# Patient Record
Sex: Female | Born: 2000 | Hispanic: Yes | Smoking: Never smoker
Health system: Southern US, Community
[De-identification: ages and names within clinical notes are randomized; demographics above are authoritative.]

## PROBLEM LIST (undated history)

## (undated) DIAGNOSIS — O210 Mild hyperemesis gravidarum: Secondary | ICD-10-CM

## (undated) DIAGNOSIS — J45909 Unspecified asthma, uncomplicated: Secondary | ICD-10-CM

## (undated) DIAGNOSIS — K219 Gastro-esophageal reflux disease without esophagitis: Secondary | ICD-10-CM

## (undated) HISTORY — PX: NO PAST SURGERIES: SHX2092

---
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
## 2022-01-05 ENCOUNTER — Other Ambulatory Visit: Payer: Self-pay | Admitting: *Deleted

## 2022-01-05 MED ORDER — ONDANSETRON 4 MG PO TBDP: 4.0000 mg | ORAL_TABLET | Freq: Three times a day (TID) | ORAL | 1 refills | Status: DC | PRN

## 2022-01-05 NOTE — Progress Notes (Signed)
Zofran refilled as previously ordered per Dr Donavan Foil approval. ?

## 2022-01-19 ENCOUNTER — Inpatient Hospital Stay (HOSPITAL_COMMUNITY)
Admission: AD | Admit: 2022-01-19 | Discharge: 2022-01-19 | Disposition: A | Discharge status: Home / Self Care | Payer: Medicaid Other | Attending: Obstetrics and Gynecology | Admitting: Obstetrics and Gynecology

## 2022-01-19 ENCOUNTER — Other Ambulatory Visit: Payer: Self-pay

## 2022-01-19 ENCOUNTER — Encounter (HOSPITAL_COMMUNITY): Payer: Self-pay | Admitting: Obstetrics and Gynecology

## 2022-01-19 DIAGNOSIS — O210 Mild hyperemesis gravidarum: Secondary | ICD-10-CM | POA: Diagnosis not present

## 2022-01-19 DIAGNOSIS — O99612 Diseases of the digestive system complicating pregnancy, second trimester: Secondary | ICD-10-CM | POA: Insufficient documentation

## 2022-01-19 DIAGNOSIS — Z79899 Other long term (current) drug therapy: Secondary | ICD-10-CM | POA: Insufficient documentation

## 2022-01-19 DIAGNOSIS — K920 Hematemesis: Secondary | ICD-10-CM

## 2022-01-19 DIAGNOSIS — R12 Heartburn: Secondary | ICD-10-CM | POA: Diagnosis not present

## 2022-01-19 DIAGNOSIS — Z3A22 22 weeks gestation of pregnancy: Secondary | ICD-10-CM | POA: Diagnosis not present

## 2022-01-19 DIAGNOSIS — R109 Unspecified abdominal pain: Secondary | ICD-10-CM | POA: Insufficient documentation

## 2022-01-19 DIAGNOSIS — O26892 Other specified pregnancy related conditions, second trimester: Secondary | ICD-10-CM | POA: Insufficient documentation

## 2022-01-19 LAB — CBC WITH DIFFERENTIAL/PLATELET
Abs Immature Granulocytes: 0.13 10*3/uL — ABNORMAL HIGH (ref 0.00–0.07)
Basophils Absolute: 0 10*3/uL (ref 0.0–0.1)
Basophils Relative: 0 %
Eosinophils Absolute: 0 10*3/uL (ref 0.0–0.5)
Eosinophils Relative: 0 %
HCT: 31.2 % — ABNORMAL LOW (ref 36.0–46.0)
Hemoglobin: 10.3 g/dL — ABNORMAL LOW (ref 12.0–15.0)
Immature Granulocytes: 1 %
Lymphocytes Relative: 4 %
Lymphs Abs: 0.7 10*3/uL (ref 0.7–4.0)
MCH: 30.5 pg (ref 26.0–34.0)
MCHC: 33 g/dL (ref 30.0–36.0)
MCV: 92.3 fL (ref 80.0–100.0)
Monocytes Absolute: 0.3 10*3/uL (ref 0.1–1.0)
Monocytes Relative: 2 %
Neutro Abs: 14.8 10*3/uL — ABNORMAL HIGH (ref 1.7–7.7)
Neutrophils Relative %: 93 %
Platelets: 307 10*3/uL (ref 150–400)
RBC: 3.38 MIL/uL — ABNORMAL LOW (ref 3.87–5.11)
RDW: 13.5 % (ref 11.5–15.5)
WBC: 16 10*3/uL — ABNORMAL HIGH (ref 4.0–10.5)
nRBC: 0 % (ref 0.0–0.2)

## 2022-01-19 LAB — BASIC METABOLIC PANEL
Anion gap: 7 (ref 5–15)
BUN: <5 mg/dL — ABNORMAL LOW (ref 6–20)
CO2: 23 mmol/L (ref 22–32)
Calcium: 9 mg/dL (ref 8.9–10.3)
Chloride: 110 mmol/L (ref 98–111)
Creatinine, Ser: 0.59 mg/dL (ref 0.44–1.00)
GFR, Estimated: >60 mL/min (ref >60)
Glucose, Bld: 88 mg/dL (ref 70–99)
Potassium: 3.8 mmol/L (ref 3.5–5.1)
Sodium: 140 mmol/L (ref 135–145)

## 2022-01-19 MED ORDER — PANTOPRAZOLE SODIUM 40 MG IV SOLR
40.0000 mg | Freq: Once | INTRAVENOUS | Status: DC
  Filled 2022-01-19: qty 10

## 2022-01-19 MED ORDER — CALCIUM CARBONATE ANTACID 500 MG PO CHEW
400.0000 mg | CHEWABLE_TABLET | Freq: Three times a day (TID) | ORAL | Status: DC | PRN
  Administered 2022-01-19: 400 mg via ORAL
  Filled 2022-01-19: qty 2

## 2022-01-19 MED ORDER — PROMETHAZINE HCL 25 MG PO TABS: 25.0000 mg | ORAL_TABLET | Freq: Four times a day (QID) | ORAL | 2 refills | Status: DC | PRN

## 2022-01-19 MED ORDER — LACTATED RINGERS IV BOLUS
1000.0000 mL | Freq: Once | INTRAVENOUS | Status: AC
  Administered 2022-01-19: 1000 mL via INTRAVENOUS

## 2022-01-19 MED ORDER — ONDANSETRON 4 MG PO TBDP
4.0000 mg | ORAL_TABLET | Freq: Three times a day (TID) | ORAL | 2 refills | Status: DC | PRN
Start: 2022-01-19 — End: 2022-05-17

## 2022-01-19 MED ORDER — PROCHLORPERAZINE EDISYLATE 10 MG/2ML IJ SOLN
10.0000 mg | Freq: Once | INTRAMUSCULAR | Status: AC
  Administered 2022-01-19: 10 mg via INTRAVENOUS
  Filled 2022-01-19: qty 2

## 2022-01-19 MED ORDER — PANTOPRAZOLE SODIUM 40 MG PO TBEC: 40.0000 mg | DELAYED_RELEASE_TABLET | Freq: Two times a day (BID) | ORAL | 5 refills | Status: DC

## 2022-01-19 MED ORDER — PANTOPRAZOLE 80MG IVPB - SIMPLE MED
80.0000 mg | Freq: Once | INTRAVENOUS | Status: AC
  Administered 2022-01-19: 80 mg via INTRAVENOUS
  Filled 2022-01-19: qty 80

## 2022-01-19 MED ORDER — PROCHLORPERAZINE MALEATE 10 MG PO TABS: 10.0000 mg | ORAL_TABLET | Freq: Two times a day (BID) | ORAL | 2 refills | Status: DC | PRN

## 2022-01-19 MED ORDER — SODIUM CHLORIDE 0.9 % IV SOLN
8.0000 mg | Freq: Once | INTRAVENOUS | Status: AC
  Administered 2022-01-19: 8 mg via INTRAVENOUS
  Filled 2022-01-19: qty 4

## 2022-01-19 MED ORDER — LACTATED RINGERS IV SOLN: INTRAVENOUS | Status: DC

## 2022-01-19 NOTE — MAU Provider Note (Signed)
?History  ?  ? ?CSN: [id number] ? ?Arrival date and time: 01/19/22 1448 ? ? Event Date/Time  ? First Provider Initiated Contact with Patient 01/19/22 1528   ?  ? ?Chief Complaint  ?Patient presents with  ? Emesis  ? Nausea  ? Abdominal Pain  ? ?Gina Meyer is a 21 y.o. G1P0 at 73w6dwho receives care at [facility].  She presents today for Emesis, Nausea, and Abdominal Pain.  She states she started having vomiting around 0700 that has been ongoing.  She reports about 20 incidents of vomiting with onset of bloody vomit around 0800.  She states she attempted to take her medications, but threw them up and did not have any refills. Patient states she is regularly taking promethazine, protonix, and zofran.  She states she is taking some other medications too, but can not recall the name.  Patient is actively vomiting while speaking with provider.  ? ? ?OB History   ? ? Gravida  ?1  ? Para  ?   ? Term  ?   ? Preterm  ?   ? AB  ?   ? Living  ?   ?  ? ? SAB  ?   ? IAB  ?   ? Ectopic  ?   ? Multiple  ?   ? Live Births  ?   ?   ?  ?  ? ? ?Past Medical History:  ?Diagnosis Date  ? Asthma   ? ? ?Past Surgical History:  ?Procedure Laterality Date  ? NO PAST SURGERIES    ? ? ?History reviewed. No pertinent family history. ? ?Social History  ? ?Tobacco Use  ? Smoking status: Never  ? Smokeless tobacco: Never  ?Vaping Use  ? Vaping Use: Never used  ?Substance Use Topics  ? Alcohol use: Never  ? Drug use: Not Currently  ?  Types: Marijuana  ?  Comment: last used September 2022  ? ? ?Allergies: No Known Allergies ? ?Medications Prior to Admission  ?Medication Sig Dispense Refill Last Dose  ? aspirin EC 81 MG tablet Take 1 tablet (81 mg total) by mouth daily. Swallow whole. 30 tablet 11 01/18/2022  ? docusate sodium (COLACE) 100 MG capsule Take 1 capsule (100 mg total) by mouth 2 (two) times daily as needed. 30 capsule 2 01/18/2022  ? glycopyrrolate (ROBINUL) 2 MG tablet Take 1 tablet (2 mg total) by mouth 3 (three) times daily as  needed. 30 tablet 3 01/18/2022  ? ondansetron (ZOFRAN-ODT) 4 MG disintegrating tablet Take 1 tablet (4 mg total) by mouth every 8 (eight) hours as needed for nausea or vomiting. 30 tablet 1 01/18/2022  ? pantoprazole (PROTONIX) 40 MG tablet Take 1 tablet (40 mg total) by mouth daily. 30 tablet 5 01/18/2022  ? Prenatal Vit-Fe Phos-FA-Omega (VITAFOL GUMMIES) 3.33-0.333-34.8 MG CHEW Chew 3 tablets by mouth daily. 90 tablet 11 01/18/2022  ? prochlorperazine (COMPAZINE) 10 MG tablet Take 1 tablet (10 mg total) by mouth 2 (two) times daily as needed for nausea or vomiting. 30 tablet 2 01/18/2022  ? promethazine (PHENERGAN) 25 MG tablet Take 1 tablet (25 mg total) by mouth every 6 (six) hours as needed for nausea or vomiting. 30 tablet 2 01/18/2022  ? scopolamine (TRANSDERM-SCOP) 1 MG/3DAYS Place 1 patch (1.5 mg total) onto the skin every 3 (three) days. 10 patch 12 01/19/2022  ? acetaminophen (TYLENOL) 500 MG tablet Take 1,000 mg by mouth every 6 (six) hours as needed for mild pain or headache. (  Patient not taking: Reported on 12/26/2021)     ? Blood Pressure Monitoring (BLOOD PRESSURE KIT) DEVI 1 Device by Does not apply route as needed. (Patient not taking: Reported on 12/26/2021) 1 each 0   ? metoCLOPramide (REGLAN) 10 MG tablet Take 1 tablet (10 mg total) by mouth every 6 (six) hours. (Patient not taking: Reported on 12/26/2021) 30 tablet 2   ? Misc. Devices (GOJJI WEIGHT SCALE) MISC 1 Units by Does not apply route as directed. (Patient not taking: Reported on 12/26/2021) 1 each 0   ? promethazine (PHENERGAN) 25 MG suppository Place 1 suppository (25 mg total) rectally every 6 (six) hours as needed for nausea or vomiting. (Patient not taking: Reported on 12/26/2021) 12 each 2   ? ? ?Review of Systems  ?Gastrointestinal:  Positive for nausea and vomiting.  ?Physical Exam  ? ?Temperature 98.9 ?F (37.2 ?C), temperature source Oral, last menstrual period 08/12/2021. ? ?Physical Exam ?Vitals reviewed.  ?Constitutional:   ?   Appearance:  She is well-developed.  ?HENT:  ?   Head: Normocephalic and atraumatic.  ?Eyes:  ?   Conjunctiva/sclera: Conjunctivae normal.  ?Cardiovascular:  ?   Rate and Rhythm: Normal rate and regular rhythm.  ?Pulmonary:  ?   Effort: Pulmonary effort is normal. No respiratory distress.  ?Skin: ?   General: Skin is warm and dry.  ?Neurological:  ?   Mental Status: She is alert and oriented to person, place, and time.  ?Psychiatric:     ?   Mood and Affect: Mood normal.     ?   Behavior: Behavior normal.  ? ? ?MAU Course  ?Procedures ?Results for orders placed or performed during the hospital encounter of 01/19/22 (from the past 24 hour(s))  ?CBC with Differential/Platelet     Status: Abnormal  ? Collection Time: 01/19/22  4:10 PM  ?Result Value Ref Range  ? WBC 16.0 (H) 4.0 - 10.5 K/uL  ? RBC 3.38 (L) 3.87 - 5.11 MIL/uL  ? Hemoglobin 10.3 (L) 12.0 - 15.0 g/dL  ? HCT 31.2 (L) 36.0 - 46.0 %  ? MCV 92.3 80.0 - 100.0 fL  ? MCH 30.5 26.0 - 34.0 pg  ? MCHC 33.0 30.0 - 36.0 g/dL  ? RDW 13.5 11.5 - 15.5 %  ? Platelets 307 150 - 400 K/uL  ? nRBC 0.0 0.0 - 0.2 %  ? Neutrophils Relative % 93 %  ? Neutro Abs 14.8 (H) 1.7 - 7.7 K/uL  ? Lymphocytes Relative 4 %  ? Lymphs Abs 0.7 0.7 - 4.0 K/uL  ? Monocytes Relative 2 %  ? Monocytes Absolute 0.3 0.1 - 1.0 K/uL  ? Eosinophils Relative 0 %  ? Eosinophils Absolute 0.0 0.0 - 0.5 K/uL  ? Basophils Relative 0 %  ? Basophils Absolute 0.0 0.0 - 0.1 K/uL  ? Immature Granulocytes 1 %  ? Abs Immature Granulocytes 0.13 (H) 0.00 - 0.07 K/uL  ? ? ?MDM ?Start IV ?LR Bolus  ?Antiemetic ?PPI ?Labs: CBC/D, BMP ?Assessment and Plan  ?21 year old, G1P0  ?SIUP at 22.6 weeks ?HyperEmesis ?Hematemesis  ? ?-Reviewed POC with patient. ?-Exam performed.  ?-Start IV,LR Bolus ?-Labs ordered ?-Discussed usage of Protonix, compazine, and Zofran for management of symptoms ?-Patient agreeable.  ?-In consideration of hematemesis will give 28m of Protonix ?-Compazine 167mIV ?-Will follow with Zofran.  ?-Will monitor and  reassess ? ?JeMaryann Conners3/30/2023, 3:28 PM  ? ?Reassessment (4:42 PM) ?-Nurse reports patient continues to have vomiting, but now  consistency is coffee ground. ?-GI paged and Dr. Tonia Brooms returns call for consult. Informed of patient status, evaluation, interventions, and results. Advised: ?*Agrees with infusion of Protonix in setting of hematemesis. ?*Reviewed potential differentials including esophagitis or Mallory-Weiss Tear. ?*Recommends observation with GI consult. States will place patient on list for inpatient consult/evaluation. ?-Informed that provider will discuss observation recommendation with OB attending. ?-Patient updated on consult and recommendation.  ?-Reports vomiting has improved with compazine, however reports some heartburn and requesting tums. Ordered ?-Zofran ordered and infusing. ? ?Reassessment (5:38 PM) ?-Patient states she does not want to stay overnight d/t social issues. ?-Dr. Kathlen Mody consulted and informed of patient status, evaluation, interventions, and results. Advised: ?*Okay with discharge if patient desires.  ?*Call GI and consult regarding appropriate discharge medications. ?-Dr. Mamie Nick. Mann-Hung consulted and advised continue Protonix and okay to increase to BID. No need for outpatient appt unless symptoms return. ?**We appreciate Dr. Adriana Mccallum consultation and collaboration of care with this patient. ?-Patient informed of plan and medication list modified to reflect new changes. ?-Precautions reviewed. ?-Encouraged to call primary office or return to MAU if symptoms worsen or with the onset of new symptoms. ?-Discharged to home in improved condition. ? ?Maryann Conners MSN, CNM ?Energy manager, Center for [employer] ? ? ? ? ?

## 2022-01-19 NOTE — MAU Note (Signed)
.  Gina Meyer is a 21 y.o. at 108w6d here in MAU reporting: pt reports she has been vomiting with a lot of blood since 8 am, pt reports she has vomited at least 20 times and her lower stomach hurts every time she vomits now. Feels lightheaded and dizzy every time she stands up ?Onset of complaint: 8am ?Pain score: 9/10 ?  ?FHT:138 ?Lab orders placed from triage:urine ?   ? ?

## 2022-02-02 ENCOUNTER — Ambulatory Visit: Payer: Medicaid Other | Admitting: *Deleted

## 2022-02-02 ENCOUNTER — Ambulatory Visit: Payer: Medicaid Other | Attending: Maternal & Fetal Medicine

## 2022-02-02 VITALS — BP 106/73 | HR 91

## 2022-02-02 DIAGNOSIS — F1291 Cannabis use, unspecified, in remission: Secondary | ICD-10-CM

## 2022-02-02 DIAGNOSIS — Z3401 Encounter for supervision of normal first pregnancy, first trimester: Secondary | ICD-10-CM

## 2022-02-02 DIAGNOSIS — J45909 Unspecified asthma, uncomplicated: Secondary | ICD-10-CM | POA: Diagnosis not present

## 2022-02-02 DIAGNOSIS — O210 Mild hyperemesis gravidarum: Secondary | ICD-10-CM | POA: Diagnosis not present

## 2022-02-02 DIAGNOSIS — Z3A24 24 weeks gestation of pregnancy: Secondary | ICD-10-CM | POA: Insufficient documentation

## 2022-02-02 DIAGNOSIS — O99512 Diseases of the respiratory system complicating pregnancy, second trimester: Secondary | ICD-10-CM | POA: Insufficient documentation

## 2022-02-02 DIAGNOSIS — Z362 Encounter for other antenatal screening follow-up: Secondary | ICD-10-CM

## 2022-02-02 DIAGNOSIS — O99322 Drug use complicating pregnancy, second trimester: Secondary | ICD-10-CM | POA: Diagnosis not present

## 2022-02-20 ENCOUNTER — Encounter: Payer: Medicaid Other | Admitting: Advanced Practice Midwife

## 2022-02-20 ENCOUNTER — Other Ambulatory Visit: Payer: Medicaid Other

## 2022-02-20 NOTE — Progress Notes (Deleted)
   PRENATAL VISIT NOTE  Subjective:  Gina Meyer is a 21 y.o. G1P0 at [redacted]w[redacted]d being seen today for ongoing prenatal care.  She is currently monitored for the following issues for this {Blank single:19197::"high-risk","low-risk"} pregnancy and has Encounter for supervision of normal first pregnancy in first trimester and Hyperemesis on their problem list.  Patient reports {sx:14538}.   .  .   . Denies leaking of fluid.   The following portions of the patient's history were reviewed and updated as appropriate: allergies, current medications, past family history, past medical history, past social history, past surgical history and problem list.   Objective:  There were no vitals filed for this visit.  Fetal Status:           General:  Alert, oriented and cooperative. Patient is in no acute distress.  Skin: Skin is warm and dry. No rash noted.   Cardiovascular: Normal heart rate noted  Respiratory: Normal respiratory effort, no problems with respiration noted  Abdomen: Soft, gravid, appropriate for gestational age.        Pelvic: {Blank single:19197::"Cervical exam performed in the presence of a chaperone","Cervical exam deferred"}        Extremities: Normal range of motion.     Mental Status: Normal mood and affect. Normal behavior. Normal judgment and thought content.   Assessment and Plan:  Pregnancy: G1P0 at [redacted]w[redacted]d 1. Encounter for supervision of normal first pregnancy in first trimester ***  2. Hyperemesis gravidarum ***  3. [redacted] weeks gestation of pregnancy ***  {Blank single:19197::"Term","Preterm"} labor symptoms and general obstetric precautions including but not limited to vaginal bleeding, contractions, leaking of fluid and fetal movement were reviewed in detail with the patient. Please refer to After Visit Summary for other counseling recommendations.   No follow-ups on file.  Future Appointments  Date Time Provider [place]  02/20/2022  9:15 AM [facility]  [facility] None  02/20/2022  9:35 AM [facility], Kathie Dike, CNM [facility] None    Fatima Blank, CNM

## 2022-03-15 ENCOUNTER — Other Ambulatory Visit: Payer: Self-pay

## 2022-03-29 ENCOUNTER — Encounter: Payer: Medicaid Other | Admitting: Obstetrics & Gynecology

## 2022-03-30 ENCOUNTER — Telehealth: Payer: Self-pay

## 2022-04-08 ENCOUNTER — Other Ambulatory Visit: Payer: Self-pay

## 2022-04-19 ENCOUNTER — Encounter: Payer: Self-pay | Admitting: Obstetrics and Gynecology

## 2022-04-19 ENCOUNTER — Ambulatory Visit (INDEPENDENT_AMBULATORY_CARE_PROVIDER_SITE_OTHER): Payer: Medicaid Other | Admitting: Obstetrics and Gynecology

## 2022-04-19 DIAGNOSIS — Z3401 Encounter for supervision of normal first pregnancy, first trimester: Secondary | ICD-10-CM

## 2022-04-19 DIAGNOSIS — Z3A35 35 weeks gestation of pregnancy: Secondary | ICD-10-CM

## 2022-04-19 NOTE — Progress Notes (Signed)
   PRENATAL VISIT NOTE  Subjective:  Gina Meyer is a 21 y.o. G1P0 at [redacted]w[redacted]d being seen today for ongoing prenatal care.  She is currently monitored for the following issues for this low-risk pregnancy and has Encounter for supervision of normal first pregnancy in first trimester and Hyperemesis on their problem list.  Patient reports no complaints.  Contractions: Not present. Vag. Bleeding: None.  Movement: Present. Denies leaking of fluid.   The following portions of the patient's history were reviewed and updated as appropriate: allergies, current medications, past family history, past medical history, past social history, past surgical history and problem list.   Objective:   Vitals:   04/19/22 1407  BP: 128/82  Pulse: 96  Weight: 177 lb (80.3 kg)    Fetal Status: Fetal Heart Rate (bpm): 140 Fundal Height: 35 cm Movement: Present     General:  Alert, oriented and cooperative. Patient is in no acute distress.  Skin: Skin is warm and dry. No rash noted.   Cardiovascular: Normal heart rate noted  Respiratory: Normal respiratory effort, no problems with respiration noted  Abdomen: Soft, gravid, appropriate for gestational age.  Pain/Pressure: Absent     Pelvic: Cervical exam deferred        Extremities: Normal range of motion.     Mental Status: Normal mood and affect. Normal behavior. Normal judgment and thought content.   Assessment and Plan:  Pregnancy: G1P0 at [redacted]w[redacted]d 1. Encounter for supervision of normal first pregnancy in first trimester Patient missed several appointments as her phone was disconnected Third trimester labs and A1C today Cultures next visit Patient plans birth control pills for contraception Patient has a pediatrician in [facility]  Preterm labor symptoms and general obstetric precautions including but not limited to vaginal bleeding, contractions, leaking of fluid and fetal movement were reviewed in detail with the patient. Please refer to After  Visit Summary for other counseling recommendations.   Return in about 1 week (around 04/26/2022) for in person, ROB, Low risk.  No future appointments.  Catalina Antigua, MD

## 2022-04-19 NOTE — Progress Notes (Signed)
Pt states needs refill on Pepcid.

## 2022-04-20 LAB — CBC
Hematocrit: 31 % — ABNORMAL LOW (ref 34.0–46.6)
Hemoglobin: 10 g/dL — ABNORMAL LOW (ref 11.1–15.9)
MCH: 27.7 pg (ref 26.6–33.0)
MCHC: 32.3 g/dL (ref 31.5–35.7)
MCV: 86 fL (ref 79–97)
Platelets: 368 10*3/uL (ref 150–450)
RBC: 3.61 x10E6/uL — ABNORMAL LOW (ref 3.77–5.28)
RDW: 12.8 % (ref 11.7–15.4)
WBC: 13.1 10*3/uL — ABNORMAL HIGH (ref 3.4–10.8)

## 2022-04-20 LAB — HEMOGLOBIN A1C
Est. average glucose Bld gHb Est-mCnc: 103 mg/dL
Hgb A1c MFr Bld: 5.2 % (ref 4.8–5.6)

## 2022-04-20 LAB — HIV ANTIBODY (ROUTINE TESTING W REFLEX): HIV Screen 4th Generation wRfx: NONREACTIVE

## 2022-04-20 LAB — RPR: RPR Ser Ql: NONREACTIVE

## 2022-04-27 ENCOUNTER — Encounter: Payer: Self-pay | Admitting: Obstetrics and Gynecology

## 2022-04-27 ENCOUNTER — Other Ambulatory Visit (HOSPITAL_COMMUNITY)
Admission: RE | Admit: 2022-04-27 | Discharge: 2022-04-27 | Disposition: A | Discharge status: Home / Self Care | Payer: Medicaid Other | Source: Ambulatory Visit | Attending: Obstetrics and Gynecology | Admitting: Obstetrics and Gynecology

## 2022-04-27 ENCOUNTER — Ambulatory Visit (INDEPENDENT_AMBULATORY_CARE_PROVIDER_SITE_OTHER): Payer: Medicaid Other | Admitting: Obstetrics and Gynecology

## 2022-04-27 VITALS — BP 120/77 | HR 102 | Wt 176.3 lb

## 2022-04-27 DIAGNOSIS — Z3403 Encounter for supervision of normal first pregnancy, third trimester: Secondary | ICD-10-CM | POA: Insufficient documentation

## 2022-04-27 DIAGNOSIS — Z3A36 36 weeks gestation of pregnancy: Secondary | ICD-10-CM

## 2022-04-27 NOTE — Progress Notes (Signed)
   PRENATAL VISIT NOTE  Subjective:  Gina Meyer is a 21 y.o. G1P0 at [redacted]w[redacted]d being seen today for ongoing prenatal care.  She is currently monitored for the following issues for this low-risk pregnancy and has Encounter for supervision of normal first pregnancy in third trimester and Hyperemesis on their problem list.  Patient doing well with no acute concerns today. She reports no complaints.  Contractions: Not present. Vag. Bleeding: None.  Movement: Present. Denies leaking of fluid.   The following portions of the patient's history were reviewed and updated as appropriate: allergies, current medications, past family history, past medical history, past social history, past surgical history and problem list. Problem list updated.  Objective:   Vitals:   04/27/22 1350  BP: 120/77  Pulse: (!) 102  Weight: 176 lb 4.8 oz (80 kg)    Fetal Status: Fetal Heart Rate (bpm): 145   Movement: Present     General:  Alert, oriented and cooperative. Patient is in no acute distress.  Skin: Skin is warm and dry. No rash noted.   Cardiovascular: Normal heart rate noted  Respiratory: Normal respiratory effort, no problems with respiration noted  Abdomen: Soft, gravid, appropriate for gestational age.  Pain/Pressure: Absent     Pelvic: Cervical exam deferred        Extremities: Normal range of motion.  Edema: None  Mental Status:  Normal mood and affect. Normal behavior. Normal judgment and thought content.   Assessment and Plan:  Pregnancy: G1P0 at [redacted]w[redacted]d  1. Encounter for supervision of normal first pregnancy in third trimester Continue routine prenatal care - Strep Gp B NAA - GC/Chlamydia probe amp ([place])not at [facility]  2. [redacted] weeks gestation of pregnancy   Term labor symptoms and general obstetric precautions including but not limited to vaginal bleeding, contractions, leaking of fluid and fetal movement were reviewed in detail with the patient.  Please refer to After Visit Summary  for other counseling recommendations.   Return in about 1 week (around 05/04/2022) for ROB, in person.   Mariel Aloe, MD Faculty Attending Center for [facility]

## 2022-04-27 NOTE — Progress Notes (Signed)
Pt presents for ROB visit with no concerns at this time.  

## 2022-04-28 LAB — GC/CHLAMYDIA PROBE AMP (~~LOC~~) NOT AT ARMC
Chlamydia: NEGATIVE
Comment: NEGATIVE
Comment: NORMAL
Neisseria Gonorrhea: NEGATIVE

## 2022-04-29 LAB — STREP GP B NAA: Strep Gp B NAA: NEGATIVE

## 2022-05-04 ENCOUNTER — Ambulatory Visit (INDEPENDENT_AMBULATORY_CARE_PROVIDER_SITE_OTHER): Payer: Medicaid Other | Admitting: Obstetrics and Gynecology

## 2022-05-04 VITALS — BP 125/76 | HR 109 | Wt 182.0 lb

## 2022-05-04 DIAGNOSIS — Z3A37 37 weeks gestation of pregnancy: Secondary | ICD-10-CM | POA: Diagnosis not present

## 2022-05-04 DIAGNOSIS — O99613 Diseases of the digestive system complicating pregnancy, third trimester: Secondary | ICD-10-CM

## 2022-05-04 DIAGNOSIS — K219 Gastro-esophageal reflux disease without esophagitis: Secondary | ICD-10-CM

## 2022-05-04 DIAGNOSIS — Z3403 Encounter for supervision of normal first pregnancy, third trimester: Secondary | ICD-10-CM | POA: Diagnosis not present

## 2022-05-04 MED ORDER — PANTOPRAZOLE SODIUM 40 MG PO TBEC: 40.0000 mg | DELAYED_RELEASE_TABLET | Freq: Two times a day (BID) | ORAL | 5 refills | Status: DC

## 2022-05-04 NOTE — Progress Notes (Signed)
   PRENATAL VISIT NOTE  Subjective:  Gina Meyer is a 21 y.o. G1P0 at [redacted]w[redacted]d being seen today for ongoing prenatal care.  She is currently monitored for the following issues for this low-risk pregnancy and has Encounter for supervision of normal first pregnancy in third trimester and Hyperemesis on their problem list.  Patient doing well with no acute concerns today. She reports no complaints.  Contractions: Not present. Vag. Bleeding: None.  Movement: Present. Denies leaking of fluid.   The following portions of the patient's history were reviewed and updated as appropriate: allergies, current medications, past family history, past medical history, past social history, past surgical history and problem list. Problem list updated.  Objective:   Vitals:   05/04/22 1407  BP: 125/76  Pulse: (!) 109  Weight: 182 lb (82.6 kg)    Fetal Status: Fetal Heart Rate (bpm): 140 Fundal Height: 38 cm Movement: Present     General:  Alert, oriented and cooperative. Patient is in no acute distress.  Skin: Skin is warm and dry. No rash noted.   Cardiovascular: Normal heart rate noted  Respiratory: Normal respiratory effort, no problems with respiration noted  Abdomen: Soft, gravid, appropriate for gestational age.  Pain/Pressure: Absent     Pelvic: Cervical exam deferred        Extremities: Normal range of motion.     Mental Status:  Normal mood and affect. Normal behavior. Normal judgment and thought content.   Assessment and Plan:  Pregnancy: G1P0 at [redacted]w[redacted]d  1. [redacted] weeks gestation of pregnancy   2. Encounter for supervision of normal first pregnancy in third trimester Continue routine prenatal care  3. Gastroesophageal reflux during pregnancy in third trimester, antepartum  - pantoprazole (PROTONIX) 40 MG tablet; Take 1 tablet (40 mg total) by mouth 2 (two) times daily.  Dispense: 60 tablet; Refill: 5  Term labor symptoms and general obstetric precautions including but not limited to  vaginal bleeding, contractions, leaking of fluid and fetal movement were reviewed in detail with the patient.  Please refer to After Visit Summary for other counseling recommendations.   Return in about 1 week (around 05/11/2022) for ROB, in person.   Mariel Aloe, MD Faculty Attending Center for [facility]

## 2022-05-10 ENCOUNTER — Encounter (HOSPITAL_COMMUNITY): Payer: Self-pay | Admitting: Obstetrics and Gynecology

## 2022-05-10 ENCOUNTER — Other Ambulatory Visit: Payer: Self-pay

## 2022-05-10 ENCOUNTER — Inpatient Hospital Stay (HOSPITAL_COMMUNITY)
Admission: AD | Admit: 2022-05-10 | Discharge: 2022-05-10 | Disposition: A | Discharge status: Home / Self Care | Payer: Medicaid Other | Attending: Obstetrics and Gynecology | Admitting: Obstetrics and Gynecology

## 2022-05-10 DIAGNOSIS — Z369 Encounter for antenatal screening, unspecified: Secondary | ICD-10-CM | POA: Insufficient documentation

## 2022-05-10 DIAGNOSIS — O471 False labor at or after 37 completed weeks of gestation: Secondary | ICD-10-CM | POA: Diagnosis present

## 2022-05-10 DIAGNOSIS — Z3A39 39 weeks gestation of pregnancy: Secondary | ICD-10-CM | POA: Insufficient documentation

## 2022-05-10 DIAGNOSIS — O479 False labor, unspecified: Secondary | ICD-10-CM | POA: Diagnosis not present

## 2022-05-10 DIAGNOSIS — Z3403 Encounter for supervision of normal first pregnancy, third trimester: Secondary | ICD-10-CM

## 2022-05-10 NOTE — MAU Note (Signed)
Gina Meyer is a 21 y.o. at [redacted]w[redacted]d here in MAU reporting: lower abdominal pain since yesterday and got more frequent this morning. Pain is intermittent. Comes every 30 min to an hour. Unsure if ctx or gas. No bleeding or LOF. +FM  Onset of complaint: yesterday  Pain score: abdomen 4/10, back 5/10  Vitals:   05/10/22 1433  BP: 124/80  Pulse: 99  Resp: 16  Temp: 98.5 F (36.9 C)  SpO2: 99%     FHT:155  Lab orders placed from triage: none

## 2022-05-10 NOTE — MAU Provider Note (Signed)
S: Patient is here for RN labor evaluation. Fetal tracing, vital signs, & chart reviewed   O:  Vitals:   05/10/22 1429 05/10/22 1433 05/10/22 1445  BP:  124/80 129/79  Pulse:  99 (!) 102  Resp:  16   Temp:  98.5 F (36.9 C)   TempSrc:  Oral   SpO2:  99%   Weight: 84.3 kg    Height: 5\' 5"  (1.651 m)     No results found for this or any previous visit (from the past 24 hour(s)).  Dilation: Closed Effacement (%): 50 Cervical Position: Posterior Station: -3 Presentation: Vertex Exam by:: weston,rn   FHR: 140s-150 bpm, Mod Var, accel Decels, no Accels UC: irritability, very irregular contractions   A: 1. Encounter for supervision of normal first pregnancy in third trimester   2. False labor      P:  RN to discharge home in stable condition with return precautions & fetal kick counts  [phone], MD @T @ 3:10 PM

## 2022-05-15 ENCOUNTER — Encounter (HOSPITAL_COMMUNITY): Payer: Self-pay | Admitting: Obstetrics & Gynecology

## 2022-05-15 ENCOUNTER — Other Ambulatory Visit: Payer: Self-pay

## 2022-05-15 ENCOUNTER — Inpatient Hospital Stay (HOSPITAL_COMMUNITY): Payer: Medicaid Other | Admitting: Anesthesiology

## 2022-05-15 ENCOUNTER — Inpatient Hospital Stay (HOSPITAL_COMMUNITY)
Admission: AD | Admit: 2022-05-15 | Discharge: 2022-05-17 | DRG: 807 | Disposition: A | Discharge status: Home / Self Care | Payer: Medicaid Other | Attending: Family Medicine | Admitting: Family Medicine

## 2022-05-15 DIAGNOSIS — K219 Gastro-esophageal reflux disease without esophagitis: Secondary | ICD-10-CM | POA: Diagnosis present

## 2022-05-15 DIAGNOSIS — F1290 Cannabis use, unspecified, uncomplicated: Secondary | ICD-10-CM | POA: Diagnosis present

## 2022-05-15 DIAGNOSIS — Z3A39 39 weeks gestation of pregnancy: Secondary | ICD-10-CM | POA: Diagnosis not present

## 2022-05-15 DIAGNOSIS — O139 Gestational [pregnancy-induced] hypertension without significant proteinuria, unspecified trimester: Secondary | ICD-10-CM | POA: Diagnosis present

## 2022-05-15 DIAGNOSIS — O9962 Diseases of the digestive system complicating childbirth: Secondary | ICD-10-CM | POA: Diagnosis present

## 2022-05-15 DIAGNOSIS — O99324 Drug use complicating childbirth: Secondary | ICD-10-CM | POA: Diagnosis present

## 2022-05-15 DIAGNOSIS — O134 Gestational [pregnancy-induced] hypertension without significant proteinuria, complicating childbirth: Secondary | ICD-10-CM | POA: Diagnosis present

## 2022-05-15 DIAGNOSIS — O4202 Full-term premature rupture of membranes, onset of labor within 24 hours of rupture: Secondary | ICD-10-CM | POA: Diagnosis not present

## 2022-05-15 DIAGNOSIS — Z3403 Encounter for supervision of normal first pregnancy, third trimester: Secondary | ICD-10-CM

## 2022-05-15 LAB — CBC
HCT: 30.7 % — ABNORMAL LOW (ref 36.0–46.0)
HCT: 32.2 % — ABNORMAL LOW (ref 36.0–46.0)
Hemoglobin: 10 g/dL — ABNORMAL LOW (ref 12.0–15.0)
Hemoglobin: 10.3 g/dL — ABNORMAL LOW (ref 12.0–15.0)
MCH: 27.6 pg (ref 26.0–34.0)
MCH: 27 pg (ref 26.0–34.0)
MCHC: 32.6 g/dL (ref 30.0–36.0)
MCHC: 32 g/dL (ref 30.0–36.0)
MCV: 84.8 fL (ref 80.0–100.0)
MCV: 84.3 fL (ref 80.0–100.0)
Platelets: 345 10*3/uL (ref 150–400)
Platelets: 346 10*3/uL (ref 150–400)
RBC: 3.62 MIL/uL — ABNORMAL LOW (ref 3.87–5.11)
RBC: 3.82 MIL/uL — ABNORMAL LOW (ref 3.87–5.11)
RDW: 14.1 % (ref 11.5–15.5)
RDW: 13.9 % (ref 11.5–15.5)
WBC: 13.8 10*3/uL — ABNORMAL HIGH (ref 4.0–10.5)
WBC: 16 10*3/uL — ABNORMAL HIGH (ref 4.0–10.5)
nRBC: 0 % (ref 0.0–0.2)
nRBC: 0 % (ref 0.0–0.2)

## 2022-05-15 LAB — PROTEIN / CREATININE RATIO, URINE
Creatinine, Urine: 91 mg/dL
Protein Creatinine Ratio: 0.12 mg/mg{Cre} (ref 0.00–0.15)
Total Protein, Urine: 11 mg/dL

## 2022-05-15 LAB — COMPREHENSIVE METABOLIC PANEL
ALT: 10 U/L (ref 0–44)
AST: 18 U/L (ref 15–41)
Albumin: 2.8 g/dL — ABNORMAL LOW (ref 3.5–5.0)
Alkaline Phosphatase: 187 U/L — ABNORMAL HIGH (ref 38–126)
Anion gap: 7 (ref 5–15)
BUN: 8 mg/dL (ref 6–20)
CO2: 22 mmol/L (ref 22–32)
Calcium: 9 mg/dL (ref 8.9–10.3)
Chloride: 108 mmol/L (ref 98–111)
Creatinine, Ser: 0.82 mg/dL (ref 0.44–1.00)
GFR, Estimated: >60 mL/min (ref >60)
Glucose, Bld: 87 mg/dL (ref 70–99)
Potassium: 3.7 mmol/L (ref 3.5–5.1)
Sodium: 137 mmol/L (ref 135–145)
Total Bilirubin: 0.5 mg/dL (ref 0.3–1.2)
Total Protein: 6.4 g/dL — ABNORMAL LOW (ref 6.5–8.1)

## 2022-05-15 LAB — TYPE AND SCREEN
ABO/RH(D): O POS
Antibody Screen: NEGATIVE

## 2022-05-15 LAB — RPR: RPR Ser Ql: NONREACTIVE

## 2022-05-15 MED ORDER — PHENYLEPHRINE 80 MCG/ML (10ML) SYRINGE FOR IV PUSH (FOR BLOOD PRESSURE SUPPORT)
80.0000 ug | PREFILLED_SYRINGE | INTRAVENOUS | Status: DC | PRN
  Filled 2022-05-15: qty 10

## 2022-05-15 MED ORDER — SOD CITRATE-CITRIC ACID 500-334 MG/5ML PO SOLN
30.0000 mL | ORAL | Status: DC | PRN
Start: 2022-05-15 — End: 2022-05-15

## 2022-05-15 MED ORDER — MEASLES, MUMPS & RUBELLA VAC IJ SOLR: 0.5000 mL | Freq: Once | INTRAMUSCULAR | Status: DC

## 2022-05-15 MED ORDER — ACETAMINOPHEN 325 MG PO TABS: 650.0000 mg | ORAL_TABLET | ORAL | Status: DC | PRN

## 2022-05-15 MED ORDER — FUROSEMIDE 20 MG PO TABS
20.0000 mg | ORAL_TABLET | Freq: Every day | ORAL | Status: DC
  Administered 2022-05-16: 20 mg via ORAL
  Filled 2022-05-15: qty 1

## 2022-05-15 MED ORDER — ONDANSETRON HCL 4 MG PO TABS: 4.0000 mg | ORAL_TABLET | ORAL | Status: DC | PRN

## 2022-05-15 MED ORDER — EPHEDRINE 5 MG/ML INJ: 10.0000 mg | INTRAVENOUS | Status: DC | PRN

## 2022-05-15 MED ORDER — COCONUT OIL OIL: 1.0000 | TOPICAL_OIL | Status: DC | PRN

## 2022-05-15 MED ORDER — FLEET ENEMA 7-19 GM/118ML RE ENEM
1.0000 | ENEMA | RECTAL | Status: DC | PRN
Start: 2022-05-15 — End: 2022-05-15

## 2022-05-15 MED ORDER — PRENATAL MULTIVITAMIN CH
1.0000 | ORAL_TABLET | Freq: Every day | ORAL | Status: DC
Start: 2022-05-16 — End: 2022-05-17
  Administered 2022-05-16: 1 via ORAL
  Filled 2022-05-15: qty 1

## 2022-05-15 MED ORDER — FENTANYL-BUPIVACAINE-NACL 0.5-0.125-0.9 MG/250ML-% EP SOLN
12.0000 mL/h | EPIDURAL | Status: DC | PRN
  Administered 2022-05-15: 12 mL/h via EPIDURAL
  Filled 2022-05-15 (×2): qty 250

## 2022-05-15 MED ORDER — FENTANYL CITRATE (PF) 100 MCG/2ML IJ SOLN
50.0000 ug | Freq: Once | INTRAMUSCULAR | Status: AC
  Administered 2022-05-15: 50 ug via INTRAVENOUS
  Filled 2022-05-15: qty 2

## 2022-05-15 MED ORDER — CALCIUM CARBONATE ANTACID 500 MG PO CHEW
2.0000 | CHEWABLE_TABLET | Freq: Once | ORAL | Status: AC
  Administered 2022-05-15: 400 mg via ORAL
  Filled 2022-05-15: qty 2

## 2022-05-15 MED ORDER — ONDANSETRON HCL 4 MG/2ML IJ SOLN
4.0000 mg | Freq: Four times a day (QID) | INTRAMUSCULAR | Status: DC | PRN
  Administered 2022-05-15: 4 mg via INTRAVENOUS
  Filled 2022-05-15: qty 2

## 2022-05-15 MED ORDER — ONDANSETRON HCL 4 MG/2ML IJ SOLN: 4.0000 mg | INTRAMUSCULAR | Status: DC | PRN

## 2022-05-15 MED ORDER — OXYCODONE-ACETAMINOPHEN 5-325 MG PO TABS: 2.0000 | ORAL_TABLET | ORAL | Status: DC | PRN

## 2022-05-15 MED ORDER — DIPHENHYDRAMINE HCL 25 MG PO CAPS: 25.0000 mg | ORAL_CAPSULE | Freq: Four times a day (QID) | ORAL | Status: DC | PRN

## 2022-05-15 MED ORDER — OXYTOCIN-SODIUM CHLORIDE 30-0.9 UT/500ML-% IV SOLN
2.5000 [IU]/h | INTRAVENOUS | Status: DC
  Filled 2022-05-15: qty 500

## 2022-05-15 MED ORDER — DIPHENHYDRAMINE HCL 50 MG/ML IJ SOLN: 12.5000 mg | INTRAMUSCULAR | Status: DC | PRN

## 2022-05-15 MED ORDER — FENTANYL CITRATE (PF) 100 MCG/2ML IJ SOLN
100.0000 ug | INTRAMUSCULAR | Status: DC | PRN
  Administered 2022-05-15 (×4): 100 ug via INTRAVENOUS
  Filled 2022-05-15 (×4): qty 2

## 2022-05-15 MED ORDER — DIBUCAINE (PERIANAL) 1 % EX OINT: 1.0000 | TOPICAL_OINTMENT | CUTANEOUS | Status: DC | PRN

## 2022-05-15 MED ORDER — LIDOCAINE HCL (PF) 1 % IJ SOLN: 30.0000 mL | INTRAMUSCULAR | Status: DC | PRN

## 2022-05-15 MED ORDER — TETANUS-DIPHTH-ACELL PERTUSSIS 5-2.5-18.5 LF-MCG/0.5 IM SUSY: 0.5000 mL | PREFILLED_SYRINGE | Freq: Once | INTRAMUSCULAR | Status: DC

## 2022-05-15 MED ORDER — SENNOSIDES-DOCUSATE SODIUM 8.6-50 MG PO TABS
2.0000 | ORAL_TABLET | Freq: Every day | ORAL | Status: DC
Start: 2022-05-16 — End: 2022-05-17
  Administered 2022-05-16: 2 via ORAL
  Filled 2022-05-15: qty 2

## 2022-05-15 MED ORDER — PROMETHAZINE HCL 25 MG/ML IJ SOLN
25.0000 mg | Freq: Three times a day (TID) | INTRAMUSCULAR | Status: DC | PRN
  Administered 2022-05-15: 25 mg via INTRAMUSCULAR
  Filled 2022-05-15 (×2): qty 1

## 2022-05-15 MED ORDER — LACTATED RINGERS IV SOLN
500.0000 mL | Freq: Once | INTRAVENOUS | Status: AC
  Administered 2022-05-15: 500 mL via INTRAVENOUS

## 2022-05-15 MED ORDER — PHENYLEPHRINE 80 MCG/ML (10ML) SYRINGE FOR IV PUSH (FOR BLOOD PRESSURE SUPPORT): 80.0000 ug | PREFILLED_SYRINGE | INTRAVENOUS | Status: DC | PRN

## 2022-05-15 MED ORDER — TERBUTALINE SULFATE 1 MG/ML IJ SOLN
INTRAMUSCULAR | Status: AC
  Filled 2022-05-15: qty 1

## 2022-05-15 MED ORDER — PROMETHAZINE HCL 25 MG/ML IJ SOLN: 12.5000 mg | Freq: Three times a day (TID) | INTRAMUSCULAR | Status: DC | PRN

## 2022-05-15 MED ORDER — WITCH HAZEL-GLYCERIN EX PADS: 1.0000 | MEDICATED_PAD | CUTANEOUS | Status: DC | PRN

## 2022-05-15 MED ORDER — LACTATED RINGERS IV SOLN: INTRAVENOUS | Status: DC

## 2022-05-15 MED ORDER — IBUPROFEN 600 MG PO TABS
600.0000 mg | ORAL_TABLET | Freq: Four times a day (QID) | ORAL | Status: DC
Start: 2022-05-16 — End: 2022-05-17
  Administered 2022-05-16 – 2022-05-17 (×5): 600 mg via ORAL
  Filled 2022-05-15 (×6): qty 1

## 2022-05-15 MED ORDER — OXYTOCIN BOLUS FROM INFUSION
333.0000 mL | Freq: Once | INTRAVENOUS | Status: AC
  Administered 2022-05-15: 333 mL via INTRAVENOUS

## 2022-05-15 MED ORDER — BENZOCAINE-MENTHOL 20-0.5 % EX AERO: 1.0000 | INHALATION_SPRAY | CUTANEOUS | Status: DC | PRN

## 2022-05-15 MED ORDER — MISOPROSTOL 50MCG HALF TABLET
50.0000 ug | ORAL_TABLET | ORAL | Status: DC | PRN
  Administered 2022-05-15: 50 ug via BUCCAL
  Filled 2022-05-15: qty 1

## 2022-05-15 MED ORDER — TERBUTALINE SULFATE 1 MG/ML IJ SOLN: 0.2500 mg | Freq: Once | INTRAMUSCULAR | Status: DC | PRN

## 2022-05-15 MED ORDER — LIDOCAINE HCL (PF) 1 % IJ SOLN
INTRAMUSCULAR | Status: DC | PRN
  Administered 2022-05-15 (×2): 5 mL via EPIDURAL

## 2022-05-15 MED ORDER — SIMETHICONE 80 MG PO CHEW: 80.0000 mg | CHEWABLE_TABLET | ORAL | Status: DC | PRN

## 2022-05-15 MED ORDER — LACTATED RINGERS IV BOLUS
1000.0000 mL | Freq: Once | INTRAVENOUS | Status: AC
  Administered 2022-05-15: 1000 mL via INTRAVENOUS

## 2022-05-15 MED ORDER — OXYCODONE-ACETAMINOPHEN 5-325 MG PO TABS: 1.0000 | ORAL_TABLET | ORAL | Status: DC | PRN

## 2022-05-15 MED ORDER — LACTATED RINGERS IV SOLN: 500.0000 mL | INTRAVENOUS | Status: DC | PRN

## 2022-05-15 NOTE — Progress Notes (Addendum)
Labor Progress Note Gina Meyer is a 21 y.o. G1P0 at [redacted]w[redacted]d presented for IOL for GHTN S: Patient is uncomfortable 2/2 labor pains, and wanting an epidural.   O:  BP 126/83   Pulse 80   Temp 98.7 F (37.1 C) (Oral)   Resp 18   Ht 5\' 5"  (1.651 m)   Wt 85 kg   LMP 08/12/2021   SpO2 100%   BMI 31.18 kg/m  EFM: baseline 125/good variability/no accels/ variable decels present  CVE: Dilation: 4 Effacement (%): 100 Cervical Position: Posterior Station: -2 Presentation: Vertex Exam by:: [email or web address], CNM   A&P: 21 y.o. G1P0 [redacted]w[redacted]d who presents for IOL for GHTN.  #Labor: Progressing well. Patient cervical exam is 4/100/-2. We will continue with expectant management at this time and reassess later. #Pain: Epidural #FWB: Category II  #GBS negative  #GHTN  BP have been appropriate since admission this morning. Pre-E labs all WNL. Will continue to monitor.   [redacted]w[redacted]d, MD Center for [facility], [facility] 3:29 PM  Midwife attestation I agree with the documentation in the resident's note.   [facility], CNM 3:56 PM

## 2022-05-15 NOTE — Anesthesia Procedure Notes (Signed)
Epidural Patient location during procedure: OB Start time: 05/15/2022 3:17 PM End time: 05/15/2022 3:25 PM  Preanesthetic Checklist Completed: patient identified, IV checked, site marked, risks and benefits discussed, surgical consent, monitors and equipment checked, pre-op evaluation and timeout performed  Epidural Patient position: sitting Prep: DuraPrep and site prepped and draped Patient monitoring: continuous pulse ox and blood pressure Approach: midline Location: L3-L4 Injection technique: LOR air  Needle:  Needle type: Tuohy  Needle gauge: 17 G Needle length: 9 cm and 9 Needle insertion depth: 6 cm Catheter type: closed end flexible Catheter size: 19 Gauge Catheter at skin depth: 11 cm Test dose: negative and Other  Assessment Events: blood not aspirated, injection not painful, no injection resistance, no paresthesia and negative IV test  Additional Notes Patient identified. Risks and benefits discussed including failed block, incomplete  Pain control, post dural puncture headache, nerve damage, paralysis, blood pressure Changes, nausea, vomiting, reactions to medications-both toxic and allergic and post Partum back pain. All questions were answered. Patient expressed understanding and wished to proceed. Sterile technique was used throughout procedure. Epidural site was Dressed with sterile barrier dressing. No paresthesias, signs of intravascular injection Or signs of intrathecal spread were encountered.  Patient was more comfortable after the epidural was dosed. Please see RN's note for documentation of vital signs and FHR which are stable. Reason for block:procedure for pain

## 2022-05-15 NOTE — MAU Note (Signed)
.  Gina Meyer is a 21 y.o. at [redacted]w[redacted]d here in MAU reporting: regular ctx since 0200 that she reports is a 8/10. Denies VB or LOF. Reports good FM.  Onset of complaint: 0200 Pain score: 8/10 Vitals:   05/15/22 0419  BP: (!) 138/92  Pulse: (!) 101  Resp: 17  Temp: 98.6 F (37 C)  SpO2: 99%     FHT:130 Lab orders placed from triage:  mau labor

## 2022-05-15 NOTE — MAU Provider Note (Signed)
History     CSN: [id number]  Arrival date and time: 05/15/22 0358   Event Date/Time   First Provider Initiated Contact with Patient 05/15/22 [phone]      Chief Complaint  Patient presents with   Labor Eval   HPI Gina Meyer is a 21 y.o. year old G1P0 female at [redacted]w[redacted]d weeks gestation who presents to MAU reporting regular contractions since 0200; rated 8/10. She denies VB or LOF. She reports good (+) FM. Her low-risk pregnancy has had no complications. She receives [facility] with [facility]; next appt is 05/16/2022.  The FOB is present and contributing to the history taking.   OB History     Gravida  1   Para      Term      Preterm      AB      Living         SAB      IAB      Ectopic      Multiple      Live Births              Past Medical History:  Diagnosis Date   Asthma     Past Surgical History:  Procedure Laterality Date   NO PAST SURGERIES      Family History  Problem Relation Age of Onset   Diabetes Mother    Asthma Brother    Hypertension Maternal Grandmother     Social History   Tobacco Use   Smoking status: Never   Smokeless tobacco: Never  Vaping Use   Vaping Use: Never used  Substance Use Topics   Alcohol use: Never   Drug use: Not Currently    Types: Marijuana    Comment: last used September 2022    Allergies: No Known Allergies  Medications Prior to Admission  Medication Sig Dispense Refill Last Dose   ondansetron (ZOFRAN-ODT) 4 MG disintegrating tablet Take 1-2 tablets (4-8 mg total) by mouth every 8 (eight) hours as needed for nausea or vomiting. 45 tablet 2 05/14/2022   pantoprazole (PROTONIX) 40 MG tablet Take 1 tablet (40 mg total) by mouth 2 (two) times daily. 60 tablet 5 05/14/2022   Prenatal Vit-Fe Phos-FA-Omega (VITAFOL GUMMIES) 3.33-0.333-34.8 MG CHEW Chew 3 tablets by mouth daily. 90 tablet 11 05/14/2022   aspirin EC 81 MG tablet Take 1 tablet (81 mg total) by mouth daily. Swallow whole. (Patient not taking:  Reported on 04/19/2022) 30 tablet 11    Blood Pressure Monitoring (BLOOD PRESSURE KIT) DEVI 1 Device by Does not apply route as needed. (Patient not taking: Reported on 12/26/2021) 1 each 0    famotidine (PEPCID) 10 MG tablet Take 10 mg by mouth 2 (two) times daily. (Patient not taking: Reported on 04/27/2022)      Misc. Devices (GOJJI WEIGHT SCALE) MISC 1 Units by Does not apply route as directed. (Patient not taking: Reported on 12/26/2021) 1 each 0    prochlorperazine (COMPAZINE) 10 MG tablet Take 1 tablet (10 mg total) by mouth 2 (two) times daily as needed for nausea or vomiting. (Patient not taking: Reported on 04/19/2022) 45 tablet 2    promethazine (PHENERGAN) 25 MG tablet Take 1 tablet (25 mg total) by mouth every 6 (six) hours as needed for nausea or vomiting. 45 tablet 2    scopolamine (TRANSDERM-SCOP) 1 MG/3DAYS Place 1 patch (1.5 mg total) onto the skin every 3 (three) days. (Patient not taking: Reported on 04/19/2022) 10 patch 12  Review of Systems  Constitutional: Negative.   HENT: Negative.    Eyes: Negative.   Respiratory: Negative.    Cardiovascular: Negative.   Gastrointestinal:  Positive for nausea and vomiting.  Endocrine: Negative.   Genitourinary:  Positive for pelvic pain (frequent and stronger).  Musculoskeletal: Negative.   Skin: Negative.   Allergic/Immunologic: Negative.   Neurological: Negative.   Hematological: Negative.   Psychiatric/Behavioral: Negative.     Physical Exam   Patient Vitals for the past 24 hrs:  BP Temp Temp src Pulse Resp SpO2 Height Weight  05/15/22 0800 114/66 -- -- 81 -- -- -- --  05/15/22 0745 (!) 106/59 -- -- 66 -- -- -- --  05/15/22 0730 107/78 -- -- 70 -- -- -- --  05/15/22 0718 133/87 -- -- 95 -- -- -- --  05/15/22 0555 -- -- -- -- -- 100 % -- --  05/15/22 0550 -- -- -- -- -- 100 % -- --  05/15/22 0546 (!) 148/73 -- -- 100 -- -- -- --  05/15/22 0520 -- -- -- -- -- 99 % -- --  05/15/22 0515 128/77 -- -- 88 17 100 % -- --  05/15/22  0510 -- -- -- -- -- 100 % -- --  05/15/22 0505 -- -- -- -- -- 99 % -- --  05/15/22 0500 124/74 -- -- 92 -- 99 % -- --  05/15/22 0445 (!) 141/96 -- -- 99 -- 99 % -- --  05/15/22 0430 (!) 138/95 -- -- (!) 104 17 99 % -- --  05/15/22 0419 (!) 138/92 98.6 F (37 C) Oral (!) 101 17 99 % $Re'5\' 5"'mbN$  (1.651 m) 85 kg    Physical Exam Vitals and nursing note reviewed.  Constitutional:      Appearance: Normal appearance. She is normal weight.  Cardiovascular:     Rate and Rhythm: Tachycardia present.  Pulmonary:     Effort: Pulmonary effort is normal.  Genitourinary:    Comments: Dilation: 1.5 Effacement (%): 70 Cervical Position: Posterior Station: -1 Presentation: Vertex Exam by:: Apolinar Junes RN  Musculoskeletal:        General: Normal range of motion.  Skin:    General: Skin is warm and dry.  Neurological:     Mental Status: She is alert and oriented to person, place, and time.  Psychiatric:        Mood and Affect: Mood normal.        Behavior: Behavior normal.        Thought Content: Thought content normal.        Judgment: Judgment normal.    REACTIVE NST - FHR: 125 bpm / moderate variability / accels present / decels absent / TOCO: regular every 4-5 mins  MAU Course  Procedures  MDM CCUA CBC CMP P/C Ratio Serial BP's   Results for orders placed or performed during the hospital encounter of 05/15/22 (from the past 24 hour(s))  CBC     Status: Abnormal   Collection Time: 05/15/22  5:00 AM  Result Value Ref Range   WBC 13.8 (H) 4.0 - 10.5 K/uL   RBC 3.62 (L) 3.87 - 5.11 MIL/uL   Hemoglobin 10.0 (L) 12.0 - 15.0 g/dL   HCT 30.7 (L) 36.0 - 46.0 %   MCV 84.8 80.0 - 100.0 fL   MCH 27.6 26.0 - 34.0 pg   MCHC 32.6 30.0 - 36.0 g/dL   RDW 14.1 11.5 - 15.5 %   Platelets 345 150 - 400 K/uL   nRBC  0.0 0.0 - 0.2 %  Comprehensive metabolic panel     Status: Abnormal   Collection Time: 05/15/22  5:00 AM  Result Value Ref Range   Sodium 137 135 - 145 mmol/L   Potassium 3.7 3.5 -  5.1 mmol/L   Chloride 108 98 - 111 mmol/L   CO2 22 22 - 32 mmol/L   Glucose, Bld 87 70 - 99 mg/dL   BUN 8 6 - 20 mg/dL   Creatinine, Ser 0.82 0.44 - 1.00 mg/dL   Calcium 9.0 8.9 - 10.3 mg/dL   Total Protein 6.4 (L) 6.5 - 8.1 g/dL   Albumin 2.8 (L) 3.5 - 5.0 g/dL   AST 18 15 - 41 U/L   ALT 10 0 - 44 U/L   Alkaline Phosphatase 187 (H) 38 - 126 U/L   Total Bilirubin 0.5 0.3 - 1.2 mg/dL   GFR, Estimated >60 >60 mL/min   Anion gap 7 5 - 15     Report given to and care assumed by S. Rollene Rotunda, CNM @ [address] [place] 05/15/2022, 5:21 AM  Assessment and Plan

## 2022-05-15 NOTE — Anesthesia Preprocedure Evaluation (Signed)
Anesthesia Evaluation  Patient identified by MRN, date of birth, ID band Patient awake    Reviewed: Allergy & Precautions, NPO status , Patient's Chart, lab work & pertinent test results  Airway Mallampati: II  TM Distance: >3 FB Neck ROM: Full    Dental no notable dental hx. (+) Teeth Intact   Pulmonary asthma ,    Pulmonary exam normal breath sounds clear to auscultation       Cardiovascular negative cardio ROS Normal cardiovascular exam Rhythm:Regular Rate:Normal     Neuro/Psych negative neurological ROS  negative psych ROS   GI/Hepatic Neg liver ROS, GERD  ,  Endo/Other  Obesity   Renal/GU negative Renal ROS  negative genitourinary   Musculoskeletal negative musculoskeletal ROS (+)   Abdominal (+) + obese,   Peds  Hematology  (+) Blood dyscrasia, anemia ,   Anesthesia Other Findings   Reproductive/Obstetrics (+) Pregnancy                             Anesthesia Physical Anesthesia Plan  ASA: 2  Anesthesia Plan: Epidural   Post-op Pain Management:    Induction:   PONV Risk Score and Plan:   Airway Management Planned: Natural Airway  Additional Equipment:   Intra-op Plan:   Post-operative Plan:   Informed Consent: I have reviewed the patients History and Physical, chart, labs and discussed the procedure including the risks, benefits and alternatives for the proposed anesthesia with the patient or authorized representative who has indicated his/her understanding and acceptance.       Plan Discussed with: Anesthesiologist  Anesthesia Plan Comments:         Anesthesia Quick Evaluation

## 2022-05-15 NOTE — Lactation Note (Signed)
This note was copied from a baby's chart. Lactation Consultation Note Assisted in latching mom to breast. Baby latched well suckling at intervals. Mom denied painful latch, just "feels weird." Mom excited about baby latching and BF. LC left for mom and FOB to bond w/baby BF. LC will f/u mom on  MBU tonight.  Patient Name: Gina Meyer [id number]'U Date: 05/15/2022 Reason for consult: L&D Initial assessment;Primapara;Term Age:19 hours  Maternal Data    Feeding    LATCH Score Latch: Grasps breast easily, tongue down, lips flanged, rhythmical sucking.  Audible Swallowing: None  Type of Nipple: Everted at rest and after stimulation  Comfort (Breast/Nipple): Soft / non-tender  Hold (Positioning): Assistance needed to correctly position infant at breast and maintain latch.  LATCH Score: 7   Lactation Tools Discussed/Used    Interventions Interventions: Adjust position;Assisted with latch;Support pillows;Skin to skin  Discharge    Consult Status Consult Status: Follow-up from L&D Date: 05/16/22 Follow-up type: In-patient    Charyl Dancer 05/15/2022, 7:54 PM

## 2022-05-15 NOTE — Discharge Summary (Signed)
Postpartum Discharge Summary  Date of Service updated***     Patient Name: Gina Meyer DOB: 2001-07-31 MRN: [id number]  Date of admission: 05/15/2022 Delivery date:05/15/2022  Delivering provider: Damara Klunder, Joycelyn Schmid E  Date of discharge: 05/15/2022  Admitting diagnosis: Normal labor and delivery [O80] Intrauterine pregnancy: [redacted]w[redacted]d     Secondary diagnosis:  Principal Problem:   Normal labor and delivery  Additional problems: Gestational HTN    Discharge diagnosis: Term Pregnancy Delivered and Gestational Hypertension                                              Post partum procedures:*** Augmentation: Cytotec Complications: {OB Labor/Delivery Complications:20784}  Hospital course: Induction of Labor With Vaginal Delivery   21 y.o. yo G1P0 at [redacted]w[redacted]d was admitted to the hospital 05/15/2022 for induction of labor.  Indication for induction: Gestational hypertension.  Pre-eclampsia labs were negative. Patient had an uncomplicated labor course as follows: Membrane Rupture Time/Date: 3:56 PM ,05/15/2022   Delivery Method:Vaginal, Spontaneous  Episiotomy:   none Lacerations:   R periurethral, not repaired hemostatic Details of delivery can be found in separate delivery note.  Patient had a routine postpartum course. She is tolerating PO, ambulating without dizziness, passing gas, urinating***. She denies headaches, vision changes, RUQ pain, chest pain***. Patient is discharged home 05/15/22.  Newborn Data: Birth date:05/15/2022  Birth time:6:51 PM  Gender:Female  Living status:Living  Apgars: ,  Weight:   Magnesium Sulfate received: {Mag received:30440022} BMZ received: No Rhophylac:N/A MMR:N/A T-DaP:Given prenatally Flu: N/A Transfusion:{Transfusion received:30440034}  Physical exam  Vitals:   05/15/22 1631 05/15/22 1702 05/15/22 1731 05/15/22 1802  BP: (!) 139/93 118/66 117/62 129/66  Pulse: 71 70 82 68  Resp: $Remo'18  16 18  'bevMN$ Temp:   98.7 F (37.1 C)   TempSrc:   Oral    SpO2:      Weight:      Height:       General: {Exam; general:21111117} Lochia: {Desc; appropriate/inappropriate:30686::"appropriate"} Uterine Fundus: {Desc; firm/soft:30687} Incision: {Exam; incision:21111123} DVT Evaluation: {Exam; dvt:2111122} Labs: Lab Results  Component Value Date   WBC 16.0 (H) 05/15/2022   HGB 10.3 (L) 05/15/2022   HCT 32.2 (L) 05/15/2022   MCV 84.3 05/15/2022   PLT 346 05/15/2022      Latest Ref Rng & Units 05/15/2022    5:00 AM  CMP  Glucose 70 - 99 mg/dL 87   BUN 6 - 20 mg/dL 8   Creatinine 0.44 - 1.00 mg/dL 0.82   Sodium 135 - 145 mmol/L 137   Potassium 3.5 - 5.1 mmol/L 3.7   Chloride 98 - 111 mmol/L 108   CO2 22 - 32 mmol/L 22   Calcium 8.9 - 10.3 mg/dL 9.0   Total Protein 6.5 - 8.1 g/dL 6.4   Total Bilirubin 0.3 - 1.2 mg/dL 0.5   Alkaline Phos 38 - 126 U/L 187   AST 15 - 41 U/L 18   ALT 0 - 44 U/L 10    Edinburgh Score:     No data to display           After visit meds:  Allergies as of 05/15/2022   No Known Allergies   Med Rec must be completed prior to using this [facility]***        Discharge home in stable condition Infant Feeding: Breast Infant Disposition:{CHL IP OB HOME WITH  QUIQNV:98721} Discharge instruction: per After Visit Summary and Postpartum booklet. Activity: Advance as tolerated. Pelvic rest for 6 weeks.  Diet: routine diet Future Appointments: Future Appointments  Date Time Provider [place]  05/16/2022  1:30 PM Truett Mainland, DO [facility] None   Follow up Visit:  Follow-up [facility] Follow up.   Specialty: Obstetrics and Gynecology Contact information: [address] [place] [address] [place] [phone]                Message sent to schedulers on 05/15/22 by Dr Thompson Grayer.  Please schedule this patient for a In person postpartum visit in 6 weeks with the following provider: Any provider. Additional  Postpartum F/U:BP check 1 week  High risk pregnancy complicated by: HTN Delivery mode:  Vaginal, Spontaneous  Anticipated Birth Control:  IUD outpatient   05/15/2022 Lenoria Chime, MD

## 2022-05-15 NOTE — H&P (Cosign Needed Addendum)
OBSTETRIC ADMISSION HISTORY AND PHYSICAL  Gina Meyer is a 21 y.o. female G1P0 with IUP at [redacted]w[redacted]d by LMP presenting for labor check and met criteria for [facility]. She reports +FMs, No LOF, no VB, no blurry vision, headaches or peripheral edema, and RUQ pain.  She plans on breast feeding. She requests IUD for birth control. She received her prenatal care at [facility]  Dating: By LMP --->  Estimated Date of Delivery: 05/19/22  Sono:    '@[redacted]w[redacted]d'$ , CWD, normal anatomy, cephalic presentation,  938 g, 69% EFW 1'13   Prenatal History/Complications:  THC Use Hyperemesis gravidarum GERD - protonix GHTN in 3rd trimester Teen pregnancy  Past Medical History: Past Medical History:  Diagnosis Date   Asthma     Past Surgical History: Past Surgical History:  Procedure Laterality Date   NO PAST SURGERIES      Obstetrical History: OB History     Gravida  1   Para      Term      Preterm      AB      Living         SAB      IAB      Ectopic      Multiple      Live Births              Social History Social History   Socioeconomic History   Marital status: Significant Other    Spouse name: Not on file   Number of children: Not on file   Years of education: Not on file   Highest education level: Not on file  Occupational History   Not on file  Tobacco Use   Smoking status: Never   Smokeless tobacco: Never  Vaping Use   Vaping Use: Never used  Substance and Sexual Activity   Alcohol use: Never   Drug use: Not Currently    Types: Marijuana    Comment: last used September 2022   Sexual activity: Yes    Partners: Male    Birth control/protection: None  Other Topics Concern   Not on file  Social History Narrative   Not on file   Social Determinants of Health   Financial Resource Strain: Not on file  Food Insecurity: Not on file  Transportation Needs: Not on file  Physical Activity: Not on file  Stress: Not on file  Social Connections: Not on file     Family History: Family History  Problem Relation Age of Onset   Diabetes Mother    Asthma Brother    Hypertension Maternal Grandmother     Allergies: No Known Allergies  Pt denies allergies to latex, iodine, or shellfish.  Medications Prior to Admission  Medication Sig Dispense Refill Last Dose   ondansetron (ZOFRAN-ODT) 4 MG disintegrating tablet Take 1-2 tablets (4-8 mg total) by mouth every 8 (eight) hours as needed for nausea or vomiting. 45 tablet 2 05/14/2022   pantoprazole (PROTONIX) 40 MG tablet Take 1 tablet (40 mg total) by mouth 2 (two) times daily. 60 tablet 5 05/14/2022   Prenatal Vit-Fe Phos-FA-Omega (VITAFOL GUMMIES) 3.33-0.333-34.8 MG CHEW Chew 3 tablets by mouth daily. 90 tablet 11 05/14/2022   aspirin EC 81 MG tablet Take 1 tablet (81 mg total) by mouth daily. Swallow whole. (Patient not taking: Reported on 04/19/2022) 30 tablet 11    Blood Pressure Monitoring (BLOOD PRESSURE KIT) DEVI 1 Device by Does not apply route as needed. (Patient not taking: Reported on 12/26/2021)  1 each 0    famotidine (PEPCID) 10 MG tablet Take 10 mg by mouth 2 (two) times daily. (Patient not taking: Reported on 04/27/2022)      Misc. Devices (GOJJI WEIGHT SCALE) MISC 1 Units by Does not apply route as directed. (Patient not taking: Reported on 12/26/2021) 1 each 0    prochlorperazine (COMPAZINE) 10 MG tablet Take 1 tablet (10 mg total) by mouth 2 (two) times daily as needed for nausea or vomiting. (Patient not taking: Reported on 04/19/2022) 45 tablet 2    promethazine (PHENERGAN) 25 MG tablet Take 1 tablet (25 mg total) by mouth every 6 (six) hours as needed for nausea or vomiting. 45 tablet 2    scopolamine (TRANSDERM-SCOP) 1 MG/3DAYS Place 1 patch (1.5 mg total) onto the skin every 3 (three) days. (Patient not taking: Reported on 04/19/2022) 10 patch 12      Review of Systems   All systems reviewed and negative except as stated in HPI  Blood pressure 126/83, pulse 80, temperature 98.7 F  (37.1 C), temperature source Oral, resp. rate 18, height 5\' 5"  (1.651 m), weight 85 kg, last menstrual period 08/12/2021, SpO2 100 %. General appearance: alert, cooperative, appears stated age, and mild distress Lungs: clear to auscultation bilaterally Heart: regular rate and rhythm Abdomen: soft, non-tender; bowel sounds normal Extremities: Homans sign is negative, no sign of DVT Presentation: cephalic Fetal monitoringBaseline: 135 bpm, Variability: Good {> 6 bpm), Accelerations: Reactive, and Decelerations: Absent Uterine activityFrequency: Every 3-4 minutes and Duration: 60 seconds Dilation: 1.5 Effacement (%): 70 Station: -2 Exam by:: [email or web address], CNM  Prenatal labs: ABO, Rh: --/--/O POS (07/24 06-09-2005) Antibody: NEG (07/24 06-09-2005) Rubella: 12.90 (01/09 1108) RPR: Non Reactive (06/28 1433)  HBsAg: Negative (01/09 1108)  HIV: Non Reactive (06/28 1433)  GBS: Negative/-- (07/06 1419)  1 hr Glucola: Not performed, A1c 5.2 (04/19/22) Genetic screening:  Normal Anatomy 04/21/22: Normal  Prenatal Transfer Tool  Maternal Diabetes: No Genetic Screening: Normal Maternal Ultrasounds/Referrals: Normal Fetal Ultrasounds or other Referrals:  None Maternal Substance Abuse:  Yes:  Type: Marijuana Significant Maternal Medications:  Meds include: Protonix Significant Maternal Lab Results: Group B Strep negative  Results for orders placed or performed during the hospital encounter of 05/15/22 (from the past 24 hour(s))  CBC   Collection Time: 05/15/22  5:00 AM  Result Value Ref Range   WBC 13.8 (H) 4.0 - 10.5 K/uL   RBC 3.62 (L) 3.87 - 5.11 MIL/uL   Hemoglobin 10.0 (L) 12.0 - 15.0 g/dL   HCT 05/17/22 (L) 17.8 - 67.7 %   MCV 84.8 80.0 - 100.0 fL   MCH 27.6 26.0 - 34.0 pg   MCHC 32.6 30.0 - 36.0 g/dL   RDW 21.0 96.7 - 93.6 %   Platelets 345 150 - 400 K/uL   nRBC 0.0 0.0 - 0.2 %  Comprehensive metabolic panel   Collection Time: 05/15/22  5:00 AM  Result Value Ref Range   Sodium 137 135 - 145 mmol/L    Potassium 3.7 3.5 - 5.1 mmol/L   Chloride 108 98 - 111 mmol/L   CO2 22 22 - 32 mmol/L   Glucose, Bld 87 70 - 99 mg/dL   BUN 8 6 - 20 mg/dL   Creatinine, Ser 05/17/22 0.44 - 1.00 mg/dL   Calcium 9.0 8.9 - 2.20 mg/dL   Total Protein 6.4 (L) 6.5 - 8.1 g/dL   Albumin 2.8 (L) 3.5 - 5.0 g/dL   AST 18 15 - 41 U/L  ALT 10 0 - 44 U/L   Alkaline Phosphatase 187 (H) 38 - 126 U/L   Total Bilirubin 0.5 0.3 - 1.2 mg/dL   GFR, Estimated >60 >60 mL/min   Anion gap 7 5 - 15  Protein / creatinine ratio, urine   Collection Time: 05/15/22  8:26 AM  Result Value Ref Range   Creatinine, Urine 91 mg/dL   Total Protein, Urine 11 mg/dL   Protein Creatinine Ratio 0.12 0.00 - 0.15 mg/mg[Cre]  Type and screen [place]   Collection Time: 05/15/22  9:17 AM  Result Value Ref Range   ABO/RH(D) O POS    Antibody Screen NEG    Sample Expiration      05/18/2022,2359 Performed at [facility], [place] [address] [place], [place] [address]     Patient Active Problem List   Diagnosis Date Noted   Normal labor and delivery 05/15/2022   Hyperemesis 10/16/2021   Encounter for supervision of normal first pregnancy in third trimester 09/19/2021    Assessment/Plan:  Gina Meyer is a 21 y.o. G1P0 at [redacted]w[redacted]d here for early labor / IOL for GHTN.  #Labor:Patient comes in for IOL for GHTN/ early labor. Her cervical exam is 1.5/70/-2. We will augment patient cytotec and reassess for further augmentation in 4 hours. #Pain: Planning epidural #FWB: Category I #ID:  GBS negative #MOF: Breast #MOC: Nexplanon #Circ:  Yes  Holley Bouche, MD  [facility] 05/15/2022, 12:34 PM   Midwife attestation: I have seen and examined this patient; I agree with above documentation in the resident's note.   PE: Gen: calm comfortable, NAD Resp: normal effort and rate Abd: gravid  ROS, labs, PMH reviewed  Assessment/Plan: [redacted] weeks gestation gHTN: neg  labs Labor: latent FWB: Cat I GBS: neg Admit to LD Cervical ripening Anticipate SVD  Julianne Handler, CNM  05/15/2022, 1:40 PM

## 2022-05-16 ENCOUNTER — Encounter: Payer: Medicaid Other | Admitting: Family Medicine

## 2022-05-16 MED ORDER — NIFEDIPINE ER OSMOTIC RELEASE 30 MG PO TB24
30.0000 mg | ORAL_TABLET | Freq: Every day | ORAL | Status: DC
  Administered 2022-05-16: 30 mg via ORAL
  Filled 2022-05-16: qty 1

## 2022-05-16 NOTE — Lactation Note (Signed)
This note was copied from a baby's chart. Lactation Consultation Note  Patient Name: Gina Meyer UPJSR'P Date: 05/16/2022   Age:19 hours   LC Note:  Attempted to visit with family, however, all members asleep at this time.   Maternal Data    Feeding    LATCH Score                    Lactation Tools Discussed/Used    Interventions    Discharge    Consult Status      Tyresse Jayson R Tashea Othman 05/16/2022, 7:19 AM

## 2022-05-16 NOTE — Lactation Note (Signed)
This note was copied from a baby's chart. Lactation Consultation Note  Patient Name: Boy Koreen Lizaola PYPPJ'K Date: 05/16/2022 Reason for consult: Initial assessment;Primapara;1st time breastfeeding;Term Age:19 hours   P1 mother whose infant is now 23 hours old.  This is a term baby at 39+3 weeks.  Mother's current feeding preference is breast/formula.  Arrived to find "Dorinda Hill" asleep.  Offered to awaken and assist with latching; mother receptive.  Removed clothing and discussed the importance of feeding STS and to awaken him prior to latching.  Taught hand expression; finger fed colostrum drops to "Dorinda Hill."  Assisted to latch in the cross cradle hold to the left breast.  Reviewed positioning, alignment and how to gently stimulate "Dorinda Hill" to keep him awake during his feedings.  Observed him feeding effectively for 15 minutes; few swallows noted.  Mother will continue to feed on cue or at least 8-12 times/24 hours.  Suggested she call her RN/LC for latch assistance as needed.    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  She does not have an electric pump for home use, however, she will be going to work or school.  Provided a manual pump with instructions for use to help evert nipples prior to latching.  #24 flange size is appropriate at this time.  Father present.   Maternal Data Has patient been taught Hand Expression?: Yes Does the patient have breastfeeding experience prior to this delivery?: No  Feeding Mother's Current Feeding Choice: Breast Milk and Formula  LATCH Score Latch: Grasps breast easily, tongue down, lips flanged, rhythmical sucking.  Audible Swallowing: A few with stimulation  Type of Nipple: Everted at rest and after stimulation (Very short shafted)  Comfort (Breast/Nipple): Soft / non-tender  Hold (Positioning): Assistance needed to correctly position infant at breast and maintain latch.  LATCH Score:  8   Lactation Tools Discussed/Used Tools: Pump;Flanges Flange Size: 24 Breast pump type: Manual Pump Education: Setup, frequency, and cleaning;Milk Storage Reason for Pumping: Nipple eversion Pumping frequency: Prior to latching and prn  Interventions Interventions: Breast feeding basics reviewed;Assisted with latch;Skin to skin;Breast massage;Hand express;Pre-pump if needed;Breast compression;Hand pump;Expressed milk;Position options;Support pillows;Adjust position;Education;LC Services brochure  Discharge Pump: Manual WIC Program: Yes  Consult Status Consult Status: Follow-up Date: 05/17/22 Follow-up type: In-patient    Dora Sims 05/16/2022, 8:46 AM

## 2022-05-16 NOTE — Anesthesia Postprocedure Evaluation (Signed)
Anesthesia Post Note  Patient: Gina Meyer  Procedure(s) Performed: AN AD HOC LABOR EPIDURAL     Patient location during evaluation: Mother Baby Anesthesia Type: Epidural Level of consciousness: awake Pain management: satisfactory to patient Vital Signs Assessment: post-procedure vital signs reviewed and stable Respiratory status: spontaneous breathing Cardiovascular status: stable Anesthetic complications: no   No notable events documented.  Last Vitals:  Vitals:   05/16/22 0245 05/16/22 0625  BP: 121/77 128/74  Pulse: 66 73  Resp: 16 16  Temp: 37.1 C 36.8 C  SpO2: 99% 98%    Last Pain:  Vitals:   05/16/22 0625  TempSrc: Oral  PainSc: 0-No pain   Pain Goal:                   [employer]

## 2022-05-16 NOTE — Progress Notes (Cosign Needed)
POSTPARTUM PROGRESS NOTE  Subjective: Gina Meyer is a 21 y.o. G1P1001 s/p SVD at [redacted]w[redacted]d  She reports she doing well. No acute events overnight. She denies any problems with ambulating, voiding or po intake. She has passed flatus. Pain is well controlled.  Lochia is normal.  Objective: Blood pressure 128/74, pulse 73, temperature 98.2 F (36.8 C), temperature source Oral, resp. rate 16, height _0  (1.651 m), weight 85 kg, last menstrual period 08/12/2021, SpO2 98 %, unknown if currently breastfeeding.  Physical Exam:  General: alert, cooperative and no distress Chest: no respiratory distress Abdomen: soft, non-tender  Uterine Fundus: firm and at level of umbilicus Extremities: No calf swelling or tenderness  no edema  Recent Labs    05/15/22 0500 05/15/22 1439  HGB 10.0* 10.3*  HCT 30.7* 32.2*    Assessment/Plan: GMaelys Kinnickis a 21y.o. G1P1001 s/p SVD at 352w3dIOL for GHTN.  Routine Postpartum Care: Doing well, pain well-controlled.  -- Continue routine care, lactation support  -- Contraception: OP IUD -- Feeding: Breast  #GHTN Patient BP has met criteria for post-partum procardia and lasix, with BP > 130/80 x 3. No severe range pressures.  -Procardia 30 mg daily -Lasix 20 mg x 5 days  Dispo: Plan for discharge tomorrow.  SoHolley BoucheMDSharon Springsor WoB[facility] 05/16/2022 7:55 AM  I personally saw and evaluated the patient, performing the key elements of the service. I developed and verified the management plan that is described in the resident's/student's note, and I agree with the content with my edits above. VSS, HRR&R, Resp unlabored, Legs neg.  FrNigel BertholdCNM 05/18/2022 9:45 AM

## 2022-05-17 ENCOUNTER — Other Ambulatory Visit (HOSPITAL_COMMUNITY): Payer: Self-pay

## 2022-05-17 DIAGNOSIS — O139 Gestational [pregnancy-induced] hypertension without significant proteinuria, unspecified trimester: Secondary | ICD-10-CM | POA: Diagnosis present

## 2022-05-17 MED ORDER — ACETAMINOPHEN 500 MG PO TABS
1000.0000 mg | ORAL_TABLET | Freq: Three times a day (TID) | ORAL | 0 refills | Status: AC | PRN
  Filled 2022-05-17: qty 60, 10d supply, fill #0

## 2022-05-17 MED ORDER — FUROSEMIDE 20 MG PO TABS
20.0000 mg | ORAL_TABLET | Freq: Every day | ORAL | 0 refills | Status: AC
  Filled 2022-05-17: qty 3, 3d supply, fill #0

## 2022-05-17 MED ORDER — NIFEDIPINE ER 30 MG PO TB24
30.0000 mg | ORAL_TABLET | Freq: Every day | ORAL | 0 refills | Status: AC
  Filled 2022-05-17: qty 60, 60d supply, fill #0

## 2022-05-17 MED ORDER — CALCIUM CARBONATE ANTACID 500 MG PO CHEW
1.0000 | CHEWABLE_TABLET | Freq: Three times a day (TID) | ORAL | Status: DC | PRN
  Administered 2022-05-17: 200 mg via ORAL
  Filled 2022-05-17: qty 1

## 2022-05-17 MED ORDER — IBUPROFEN 600 MG PO TABS
600.0000 mg | ORAL_TABLET | Freq: Four times a day (QID) | ORAL | 0 refills | Status: AC | PRN
  Filled 2022-05-17: qty 40, 10d supply, fill #0

## 2022-05-17 NOTE — Lactation Note (Signed)
This note was copied from a baby's chart. Lactation Consultation Note  Patient Name: Gina Meyer [id number] Date: 05/17/2022   Age:19 hours   P1 - Term infant at 39+3 weeks Feeding preference - Breast  Birth parent was in the bathroom when I arrived.  Spoke with family member who stated that birth parent has continued to breast feed baby.  Last LATCH score was an 8; baby is voiding/stooling.  Asked family member to have birth parent call when it is convenient for discharge teaching.  Family member verbalized understanding.   Maternal Data    Feeding    LATCH Score                    Lactation Tools Discussed/Used    Interventions    Discharge    Consult Status      Harrietta Incorvaia R Rachana Malesky 05/17/2022, 11:22 AM

## 2022-05-17 NOTE — Lactation Note (Signed)
This note was copied from a baby's chart. Lactation Consultation Note  Patient Name: Boy Wendi Lastra [id number] Date: 05/17/2022 Reason for consult: Follow-up assessment;Term;Primapara Age:19 hours  P1 - Term baby at 39+3 weeks Feeding preference - Formula  Parents have made the decision to formula feed only.  Demonstrated paced bottle feeding and birth parent finished feeding after demonstration. Baby consumed 20 mls easily; burped well during and after feedings.  Answered all questions parents had and reassured parents that they can call back for any questions/concerns after discharge.  Family has our OP phone number.    Baby was circumcised today and are now awaiting the circumcision checks to be completed before discharge.  RN updated.   Maternal Data    Feeding Mother's Current Feeding Choice: Formula Nipple Type: Slow - flow  LATCH Score                    Lactation Tools Discussed/Used    Interventions Interventions: Education;Pace feeding  Discharge Discharge Education: Engorgement and breast care  Consult Status Consult Status: Complete Date: 05/17/22 Follow-up type: Call as needed    Elia Nunley R Akim Watkinson 05/17/2022, 2:18 PM

## 2022-05-22 ENCOUNTER — Ambulatory Visit: Payer: Medicaid Other

## 2022-05-24 ENCOUNTER — Telehealth (HOSPITAL_COMMUNITY): Payer: Self-pay | Admitting: *Deleted

## 2022-05-24 NOTE — Telephone Encounter (Signed)
Mailbox is full. No message.  Duffy Rhody, RN 05-24-2022 at 1:39pm

## 2022-05-27 ENCOUNTER — Encounter: Payer: Self-pay | Admitting: Obstetrics & Gynecology

## 2022-06-30 ENCOUNTER — Ambulatory Visit: Payer: Medicaid Other | Admitting: Family Medicine

## 2022-08-29 NOTE — Telephone Encounter (Signed)
CALLED TO RESCHEDULE PT OB APPT  Outgoing call   

## 2022-11-01 ENCOUNTER — Ambulatory Visit: Payer: Medicaid Other | Admitting: Obstetrics and Gynecology

## 2022-11-29 ENCOUNTER — Ambulatory Visit (INDEPENDENT_AMBULATORY_CARE_PROVIDER_SITE_OTHER): Payer: Medicaid Other

## 2022-11-29 VITALS — BP 118/73 | HR 50 | Ht 65.0 in | Wt 176.0 lb

## 2022-11-29 DIAGNOSIS — Z3202 Encounter for pregnancy test, result negative: Secondary | ICD-10-CM | POA: Diagnosis not present

## 2022-11-29 DIAGNOSIS — Z720 Tobacco use: Secondary | ICD-10-CM

## 2022-11-29 DIAGNOSIS — Z30011 Encounter for initial prescription of contraceptive pills: Secondary | ICD-10-CM | POA: Diagnosis not present

## 2022-11-29 LAB — POCT URINE PREGNANCY: Preg Test, Ur: NEGATIVE

## 2022-11-29 MED ORDER — NORETHIN-ETH ESTRAD-FE BIPHAS 1 MG-10 MCG / 10 MCG PO TABS: 1.0000 | ORAL_TABLET | Freq: Every day | ORAL | 3 refills | Status: AC

## 2022-11-29 NOTE — Progress Notes (Signed)
   GYNECOLOGY OFFICE VISIT NOTE-BIRTH CONTROL CONSULT  History:  Zana Biancardi is a 22 y.o. Female G1P1001 here today for birth control consult and possible initiation.  She states she has taken oral contraception in the past with satisfaction.  She reports  delivery on July 24th and no method.  She is currently sexually active and endorses condom usage, but only 80% of the time. She reports last sexual encounter was yesterday and no condom used, but withdrawal method implemented.  She denies any abnormal vaginal discharge, bleeding, pelvic pain or pain or discomfort during sex. She reports LMp was 2/3-2/6 and was normal. She denies history of headaches or migraines, but is a current everyday tobacco (cigar) smoker.   Past Medical History:  Diagnosis Date   Asthma     Past Surgical History:  Procedure Laterality Date   NO PAST SURGERIES      The following portions of the patient's history were reviewed and updated as appropriate: allergies, current medications, past family history, past medical history, past social history, past surgical history and problem list.   Health Maintenance:  No pap or mammogram on file d/t age and indication.   Review of Systems:  Genito-Urinary ROS: no dysuria, trouble voiding, or hematuria Gastrointestinal ROS: negative Objective:  Vitals: BP 118/73   Pulse (!) 50   Ht 5\' 5"  (1.651 m)   Wt 176 lb (79.8 kg)   LMP 11/25/2022   BMI 29.29 kg/m   Physical Exam: Physical Exam Constitutional:      Appearance: Normal appearance.  HENT:     Head: Normocephalic and atraumatic.  Eyes:     Conjunctiva/sclera: Conjunctivae normal.  Cardiovascular:     Rate and Rhythm: Normal rate.  Pulmonary:     Effort: Pulmonary effort is normal. No respiratory distress.     Breath sounds: Normal breath sounds.  Abdominal:     General: Bowel sounds are normal.     Palpations: Abdomen is soft.     Tenderness: There is no abdominal tenderness.  Musculoskeletal:         General: Normal range of motion.     Cervical back: Normal range of motion.  Neurological:     Mental Status: She is alert and oriented to person, place, and time.  Skin:    General: Skin is warm and dry.  Psychiatric:        Mood and Affect: Mood normal.        Behavior: Behavior normal.      Labs and Imaging: No results found.  Assessment & Plan:  22 year old female Encounter for birth control consult and initiation  -Reviewed contraception usage risks and benefits.  -Discussed starting method today or Sunday as desired.  -Reviewed signs and symptoms that require discontinuation including onset of HA/migraines, chest pain, easily bruising, or elevated blood pressure. -Cautioned that smoking and h/o GHTN makes increases risk for blood pressure issues and blood clots. -Patient verbalizes understanding and wishes to proceed with oral contraception. -Instructed to call or return to office if questions or concerns arise. -Rx for LoLo Estrogen sent to pharmacy on file.  -Patient to return to office in 15 months for annual exam with initial pap smear.   Total face-to-face time with patient: 15 minutes   Gavin Pound, [place] 11/29/2022 2:56 PM

## 2022-11-29 NOTE — Progress Notes (Signed)
Pt presents for AEX. Pt requesting [facility] consult. Interested in pills. Last unprotected sex yesterday.

## 2022-12-28 ENCOUNTER — Telehealth: Payer: Self-pay

## 2022-12-28 NOTE — Telephone Encounter (Signed)
Patient states that she has been having irregular bleeding since starting on the birth control pill. Patient states that she her period will come and go lasting for about 3-4 days. She states that her bleeding will stop for about 2 days and then come back on for another full cycle. This has been going one since February.

## 2022-12-28 NOTE — Telephone Encounter (Signed)
Patient call and left message on triage vm stating that she has been having breakthrough bleeding since starting pills on 2/7.  Returned patient call. No answer. LVM for patient to return call to the office

## 2022-12-29 ENCOUNTER — Other Ambulatory Visit: Payer: Self-pay

## 2022-12-29 DIAGNOSIS — N921 Excessive and frequent menstruation with irregular cycle: Secondary | ICD-10-CM

## 2022-12-29 MED ORDER — NORETHIN ACE-ETH ESTRAD-FE 1-20 MG-MCG(24) PO TABS: 1.0000 | ORAL_TABLET | Freq: Every day | ORAL | 2 refills | Status: AC

## 2022-12-29 NOTE — Telephone Encounter (Signed)
Patient informed. Rx for LoEstrin Fe 24 has been sent to the pharmacy

## 2023-02-06 ENCOUNTER — Ambulatory Visit: Payer: Medicaid Other | Admitting: Certified Nurse Midwife

## 2023-02-06 DIAGNOSIS — Z3009 Encounter for other general counseling and advice on contraception: Secondary | ICD-10-CM

## 2023-02-06 DIAGNOSIS — T384X5A Adverse effect of oral contraceptives, initial encounter: Secondary | ICD-10-CM

## 2023-02-07 NOTE — Progress Notes (Signed)
Patient did not show up for this appointment.   Gina Meyer Danella Deis) Suzie Portela, MSN, CNM  [facility]  02/07/23 10:07 PM

## 2024-08-04 ENCOUNTER — Encounter (HOSPITAL_COMMUNITY): Payer: Self-pay

## 2024-08-04 ENCOUNTER — Other Ambulatory Visit: Payer: Self-pay

## 2024-08-04 ENCOUNTER — Emergency Department (HOSPITAL_COMMUNITY)
Admission: EM | Admit: 2024-08-04 | Discharge: 2024-08-05 | Discharge status: Against Medical Advice | Attending: Emergency Medicine | Admitting: Emergency Medicine

## 2024-08-04 DIAGNOSIS — Z5321 Procedure and treatment not carried out due to patient leaving prior to being seen by health care provider: Secondary | ICD-10-CM | POA: Insufficient documentation

## 2024-08-04 DIAGNOSIS — K625 Hemorrhage of anus and rectum: Secondary | ICD-10-CM | POA: Diagnosis not present

## 2024-08-04 DIAGNOSIS — R109 Unspecified abdominal pain: Secondary | ICD-10-CM | POA: Diagnosis present

## 2024-08-04 HISTORY — DX: Mild hyperemesis gravidarum: O21.0

## 2024-08-04 HISTORY — DX: Gastro-esophageal reflux disease without esophagitis: K21.9

## 2024-08-04 LAB — COMPREHENSIVE METABOLIC PANEL WITH GFR
ALT: 14 U/L (ref 0–44)
AST: 19 U/L (ref 15–41)
Albumin: 3.9 g/dL (ref 3.5–5.0)
Alkaline Phosphatase: 61 U/L (ref 38–126)
Anion gap: 8 (ref 5–15)
BUN: 5 mg/dL — ABNORMAL LOW (ref 6–20)
CO2: 24 mmol/L (ref 22–32)
Calcium: 9.8 mg/dL (ref 8.9–10.3)
Chloride: 103 mmol/L (ref 98–111)
Creatinine, Ser: 0.82 mg/dL (ref 0.44–1.00)
GFR, Estimated: >60 mL/min (ref >60)
Glucose, Bld: 91 mg/dL (ref 70–99)
Potassium: 4.2 mmol/L (ref 3.5–5.1)
Sodium: 135 mmol/L (ref 135–145)
Total Bilirubin: 0.4 mg/dL (ref 0.0–1.2)
Total Protein: 7.6 g/dL (ref 6.5–8.1)

## 2024-08-04 LAB — CBC WITH DIFFERENTIAL/PLATELET
Abs Immature Granulocytes: 0.05 K/uL (ref 0.00–0.07)
Basophils Absolute: 0.1 K/uL (ref 0.0–0.1)
Basophils Relative: 0 %
Eosinophils Absolute: 0.2 K/uL (ref 0.0–0.5)
Eosinophils Relative: 1 %
HCT: 41 % (ref 36.0–46.0)
Hemoglobin: 12.9 g/dL (ref 12.0–15.0)
Immature Granulocytes: 0 %
Lymphocytes Relative: 15 %
Lymphs Abs: 2 K/uL (ref 0.7–4.0)
MCH: 27.7 pg (ref 26.0–34.0)
MCHC: 31.5 g/dL (ref 30.0–36.0)
MCV: 88 fL (ref 80.0–100.0)
Monocytes Absolute: 0.6 K/uL (ref 0.1–1.0)
Monocytes Relative: 4 %
Neutro Abs: 10.6 K/uL — ABNORMAL HIGH (ref 1.7–7.7)
Neutrophils Relative %: 80 %
Platelets: 373 K/uL (ref 150–400)
RBC: 4.66 MIL/uL (ref 3.87–5.11)
RDW: 14.6 % (ref 11.5–15.5)
WBC: 13.4 K/uL — ABNORMAL HIGH (ref 4.0–10.5)
nRBC: 0 % (ref 0.0–0.2)

## 2024-08-04 LAB — TYPE AND SCREEN
ABO/RH(D): O POS
Antibody Screen: NEGATIVE

## 2024-08-04 LAB — PROTIME-INR
INR: 1 (ref 0.8–1.2)
Prothrombin Time: 14.2 s (ref 11.4–15.2)

## 2024-08-04 LAB — HCG, SERUM, QUALITATIVE: Preg, Serum: NEGATIVE

## 2024-08-04 NOTE — ED Triage Notes (Signed)
 Pt reports 3 episodes of bright red blood per rectum today. No h/x GI issues. Pt endorses nausea.

## 2024-08-04 NOTE — ED Triage Notes (Signed)
 PT presents for bright red blood in stool.  NO SOB or weakness at this time. 7/10 rectal and abd pain.

## 2024-08-04 NOTE — ED Notes (Signed)
 Called pt once again for vitals, and received no response.

## 2024-08-04 NOTE — ED Notes (Signed)
 Called pt 3x for vital reassessment, and received no response.

## 2024-08-04 NOTE — ED Provider Triage Note (Signed)
 Emergency Medicine Provider Triage Evaluation Note  Gina Meyer , a 22 y.o. female  was evaluated in triage.  Pt complains of BRB per rectum x2 only with BM. No blood thinner. No presyncope, syncope.   Review of Systems  Positive: Blood in stool Negative: Fever  Physical Exam  BP (!) 145/80 (BP Location: Right Arm)   Pulse (!) 103   Temp 98.2 F (36.8 C)   Resp 17   SpO2 96%  Gen:   Awake, no distress   Resp:  Normal effort  MSK:   Moves extremities without difficulty  Other:    Medical Decision Making  Medically screening exam initiated at 3:50 PM.  Appropriate orders placed.  Tenya Araque was informed that the remainder of the evaluation will be completed by another provider, this initial triage assessment does not replace that evaluation, and the importance of remaining in the ED until their evaluation is complete.     Shermon Warren SAILOR, PA-C 08/04/24 1551
